# Patient Record
Sex: Female | Born: 2005 | Race: Black or African American | Hispanic: No | Marital: Single | Smoking: Never smoker
Health system: Southern US, Community
[De-identification: ages and names within clinical notes are randomized; demographics above are authoritative.]

---
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
## 2024-04-14 ENCOUNTER — Ambulatory Visit

## 2024-04-14 ENCOUNTER — Encounter: Payer: Self-pay | Admitting: Family Medicine

## 2024-04-14 VITALS — BP 112/79 | HR 88 | Ht 65.0 in | Wt 104.8 lb

## 2024-04-14 DIAGNOSIS — N390 Urinary tract infection, site not specified: Secondary | ICD-10-CM | POA: Diagnosis not present

## 2024-04-14 DIAGNOSIS — R300 Dysuria: Secondary | ICD-10-CM | POA: Diagnosis present

## 2024-04-14 LAB — POCT URINE DIPSTICK
Bilirubin, UA: NEGATIVE
Glucose, UA: NEGATIVE mg/dL
Ketones, POC UA: NEGATIVE mg/dL
Nitrite, UA: NEGATIVE
POC PROTEIN,UA: >=300 — AB
Spec Grav, UA: 1.025 (ref 1.010–1.025)
Urobilinogen, UA: 0.2 U/dL
pH, UA: 7 (ref 5.0–8.0)

## 2024-04-14 LAB — POCT UA - MICROSCOPIC ONLY
RBC, Urine, Miroscopic: >20 (ref 0–2)
WBC, Ur, HPF, POC: >20 (ref 0–5)

## 2024-04-14 MED ORDER — CEFADROXIL 500 MG PO CAPS: 500.0000 mg | ORAL_CAPSULE | Freq: Two times a day (BID) | ORAL | 0 refills | Status: AC

## 2024-04-14 NOTE — Progress Notes (Signed)
    SUBJECTIVE:   CHIEF COMPLAINT / HPI:   Dysuria -Couple days of pain with peeing and peeing more often  -Some pink color to urine -No vaginal discharge changes, never been sexually active  -No fever -No abdominal or back pain  -No N/V/D -Has had 1 episode of UTI before several months ago - feels like that   OBJECTIVE:   BP 112/79   Pulse 88   Ht 5' 5 (1.651 m)   Wt 104 lb 12.8 oz (47.5 kg)   LMP 04/06/2024 (Exact Date)   SpO2 100%   BMI 17.44 kg/m   General: Well-appearing. Resting comfortably in room. CV: Normal S1/S2. No extra heart sounds. Warm and well-perfused. Pulm: Breathing comfortably on room air. CTAB. No increased WOB. Abd: Soft, non-tender, non-distended. No CVA tenderness.  Skin:  Warm, dry. Psych: Pleasant and appropriate.    ASSESSMENT/PLAN:   Assessment & Plan Dysuria Acute UTI UA suggestive of UTI. No systemic symptoms. Suspect simple cystitis.  - Cefadroxil  500 BID x 5 days  - Of note, patient reports being prescribed an antibiotic for dental infection, unsure of name of this antibiotic. Attempted to reach patient pharmacy, unable to connect with someone. Advised patient to inform us  of the antibiotic when able so that we may adjust treatment as needed. Patient amenable to this plan.    RTC as needed.   Damien Cassis, MD [facility]

## 2024-04-14 NOTE — Patient Instructions (Addendum)
 Thank you for visiting clinic today and allowing us  to participate in your care!  Please take the antibiotic (Cefadroxil ) as prescribed to treat your UTI.   Once you know the name of the medication you're taking for your tooth, please send us  a message or give us  a call to let us  know and we can adjust as needed.   Please schedule an appointment if your symptoms are not improving.    Reach out any time with any questions or concerns you may have - we are here for you!  Damien Cassis, MD [facility] [phone]
